# Patient Record
Sex: Female | Born: 1995 | Race: White | Hispanic: No | Marital: Single
Health system: Southern US, Community
[De-identification: ages and names within clinical notes are randomized; demographics above are authoritative.]

---

## 2017-10-12 DIAGNOSIS — F39 Unspecified mood [affective] disorder: Secondary | ICD-10-CM | POA: Insufficient documentation

## 2017-10-12 DIAGNOSIS — F4323 Adjustment disorder with mixed anxiety and depressed mood: Secondary | ICD-10-CM | POA: Insufficient documentation

## 2017-10-12 DIAGNOSIS — J4520 Mild intermittent asthma, uncomplicated: Secondary | ICD-10-CM | POA: Insufficient documentation

## 2017-10-12 DIAGNOSIS — J301 Allergic rhinitis due to pollen: Secondary | ICD-10-CM | POA: Insufficient documentation

## 2017-10-12 DIAGNOSIS — L2084 Intrinsic (allergic) eczema: Secondary | ICD-10-CM | POA: Insufficient documentation

## 2017-10-12 DIAGNOSIS — E559 Vitamin D deficiency, unspecified: Secondary | ICD-10-CM | POA: Insufficient documentation

## 2017-10-12 DIAGNOSIS — J3081 Allergic rhinitis due to animal (cat) (dog) hair and dander: Secondary | ICD-10-CM | POA: Insufficient documentation

## 2018-10-02 DIAGNOSIS — I10 Essential (primary) hypertension: Secondary | ICD-10-CM | POA: Insufficient documentation

## 2018-10-18 ENCOUNTER — Other Ambulatory Visit: Payer: Self-pay

## 2018-10-18 ENCOUNTER — Encounter (HOSPITAL_COMMUNITY): Payer: Self-pay

## 2018-10-18 ENCOUNTER — Emergency Department (HOSPITAL_COMMUNITY): Payer: BC Managed Care – PPO

## 2018-10-18 ENCOUNTER — Emergency Department (HOSPITAL_COMMUNITY)
Admission: EM | Admit: 2018-10-18 | Discharge: 2018-10-18 | Disposition: A | Discharge status: Home / Self Care | Payer: BC Managed Care – PPO | Attending: Emergency Medicine | Admitting: Emergency Medicine

## 2018-10-18 DIAGNOSIS — I10 Essential (primary) hypertension: Secondary | ICD-10-CM | POA: Diagnosis not present

## 2018-10-18 DIAGNOSIS — R0789 Other chest pain: Secondary | ICD-10-CM | POA: Insufficient documentation

## 2018-10-18 DIAGNOSIS — R55 Syncope and collapse: Secondary | ICD-10-CM | POA: Diagnosis not present

## 2018-10-18 DIAGNOSIS — J4520 Mild intermittent asthma, uncomplicated: Secondary | ICD-10-CM | POA: Diagnosis not present

## 2018-10-18 LAB — URINALYSIS, ROUTINE W REFLEX MICROSCOPIC
Bilirubin Urine: NEGATIVE
Glucose, UA: NEGATIVE mg/dL
Hgb urine dipstick: NEGATIVE
Ketones, ur: NEGATIVE mg/dL
Nitrite: NEGATIVE
Protein, ur: 30 mg/dL — AB
Specific Gravity, Urine: 1.02 (ref 1.005–1.030)
pH: 5 (ref 5.0–8.0)

## 2018-10-18 LAB — BASIC METABOLIC PANEL
Anion gap: 12 (ref 5–15)
BUN: 12 mg/dL (ref 6–20)
CO2: 25 mmol/L (ref 22–32)
Calcium: 9.6 mg/dL (ref 8.9–10.3)
Chloride: 102 mmol/L (ref 98–111)
Creatinine, Ser: 0.69 mg/dL (ref 0.44–1.00)
GFR calc Af Amer: >60 mL/min (ref >60)
GFR calc non Af Amer: >60 mL/min (ref >60)
Glucose, Bld: 118 mg/dL — ABNORMAL HIGH (ref 70–99)
Potassium: 3.6 mmol/L (ref 3.5–5.1)
Sodium: 139 mmol/L (ref 135–145)

## 2018-10-18 LAB — I-STAT BETA HCG BLOOD, ED (MC, WL, AP ONLY): I-stat hCG, quantitative: <5 m[IU]/mL (ref <5)

## 2018-10-18 LAB — CBC
HCT: 42.7 % (ref 36.0–46.0)
Hemoglobin: 14 g/dL (ref 12.0–15.0)
MCH: 28.7 pg (ref 26.0–34.0)
MCHC: 32.8 g/dL (ref 30.0–36.0)
MCV: 87.5 fL (ref 80.0–100.0)
Platelets: 391 10*3/uL (ref 150–400)
RBC: 4.88 MIL/uL (ref 3.87–5.11)
RDW: 11.8 % (ref 11.5–15.5)
WBC: 10.8 10*3/uL — ABNORMAL HIGH (ref 4.0–10.5)
nRBC: 0 % (ref 0.0–0.2)

## 2018-10-18 LAB — CBG MONITORING, ED: Glucose-Capillary: 107 mg/dL — ABNORMAL HIGH (ref 70–99)

## 2018-10-18 LAB — D-DIMER, QUANTITATIVE: D-Dimer, Quant: 0.31 ug/mL-FEU (ref 0.00–0.50)

## 2018-10-18 LAB — TROPONIN I (HIGH SENSITIVITY): Troponin I (High Sensitivity): <2 ng/L (ref <18)

## 2018-10-18 MED ORDER — SODIUM CHLORIDE 0.9% FLUSH
3.0000 mL | Freq: Once | INTRAVENOUS | Status: AC
  Administered 2018-10-18: 3 mL via INTRAVENOUS

## 2018-10-18 MED ORDER — SODIUM CHLORIDE 0.9 % IV BOLUS
1000.0000 mL | Freq: Once | INTRAVENOUS | Status: AC
  Administered 2018-10-18: 03:00:00 1000 mL via INTRAVENOUS

## 2018-10-18 NOTE — Discharge Instructions (Signed)
Make sure to eat regular meals and continue drinking lots of fluids. Follow-up with primary care doctor in the area.  Copies of labs and imaging studies on back for their review. Return here for any new/acute changes.

## 2018-10-18 NOTE — ED Triage Notes (Signed)
Pt reports that she woke up about 2 hours ago and walked to the bathroom, then woke up on the floor. She then went to the kitchen and passed out again and woke up on her kitchen floor with her spilled water. Pt is A&Ox4. Denies pain or injury.

## 2018-10-18 NOTE — ED Notes (Signed)
Upon entering the room, pt said has been feeling left side chest pain on and off for a week. It radiates over left shoulder to upper left back. Pt said, "It's not pain as much as uncomfortableness. It feels like I need to have my back crack, like there is pressure there."

## 2018-10-18 NOTE — ED Provider Notes (Signed)
[facility]-EMERGENCY DEPT Provider Note   CSN: [id number] Arrival date & time: 10/18/18  0107     History   Chief Complaint Chief Complaint  Patient presents with  . Loss of Consciousness    HPI Gloria Mendoza is a 23 y.o. female.     The history is provided by the patient and medical records.  Loss of Consciousness   23 year old female with history of adjustment disorder, eczema, mood disorder, seasonal allergies, vitamin D deficiency, essential hypertension, presenting to the ED after 2 syncopal events at home.  Reports she was sleeping and woke up to go to the bathroom and had a syncopal event in the bathroom.  States she did feel lightheaded, had blurred vision, and some ringing in the ears prior to this.  She is unsure how long she was unconscious.  States she was able to get up and went to the kitchen to get some water and apparently had another syncopal episode in the kitchen.  She lives alone so she called a friend who came over to check on her and she seemed to get better but wanted to be evaluated.  States currently she is feeling okay.  She is unsure if she hit her head during any of these episodes but denies any current headache, dizziness, confusion, numbness, weakness, blurred vision, tinnitus, changes in speech, or neck pain.  She is not currently on anticoagulation.  States she has been looking at the blood pressure monitor here and her pressures are somewhat lower than normal.  She is usually 130s/80s.  History reviewed. No pertinent past medical history.  Patient Active Problem List   Diagnosis Date Noted  . Essential hypertension 10/02/2018  . Adjustment disorder with mixed anxiety and depressed mood 10/12/2017  . Chronic allergic rhinitis due to animal hair and dander 10/12/2017  . Intrinsic eczema 10/12/2017  . Mild intermittent asthma without complication 10/12/2017  . Mood disorder (HCC) 10/12/2017  . Seasonal allergic rhinitis due to  pollen 10/12/2017  . Vitamin D deficiency 10/12/2017     OB History   No obstetric history on file.      Home Medications    Prior to Admission medications   Not on File    Family History History reviewed. No pertinent family history.  Social History Social History   Tobacco Use  . Smoking status: Not on file  Substance Use Topics  . Alcohol use: Not on file  . Drug use: Not on file     Allergies   Shellfish allergy, Bee venom, Cat hair extract, Dog epithelium, Dog epithelium allergy skin test, Molds & smuts, Other, and Pollen extract   Review of Systems Review of Systems  Cardiovascular: Positive for syncope.  Neurological: Positive for syncope.  All other systems reviewed and are negative.    Physical Exam Updated Vital Signs BP 102/70   Pulse 86   Temp 98.5 F (36.9 C) (Oral)   Resp (!) 22   SpO2 100%   Physical Exam Vitals signs and nursing note reviewed.  Constitutional:      Appearance: She is well-developed.     Comments: Awake, alert  HENT:     Head: Normocephalic and atraumatic.     Comments: No visible signs of head trauma Eyes:     Conjunctiva/sclera: Conjunctivae normal.     Pupils: Pupils are equal, round, and reactive to light.  Neck:     Musculoskeletal: Normal range of motion.  Cardiovascular:     Rate and  Rhythm: Normal rate and regular rhythm.     Heart sounds: Normal heart sounds.  Pulmonary:     Effort: Pulmonary effort is normal.     Breath sounds: Normal breath sounds. No stridor. No wheezing.  Abdominal:     General: Bowel sounds are normal.     Palpations: Abdomen is soft.     Hernia: No hernia is present.  Musculoskeletal: Normal range of motion.  Skin:    General: Skin is warm and dry.  Neurological:     Mental Status: She is alert and oriented to person, place, and time.     Comments: AAOx3, answering questions and following commands appropriately; equal strength UE and LE bilaterally; CN grossly intact; moves  all extremities appropriately without ataxia; no focal neuro deficits or facial asymmetry appreciated      ED Treatments / Results  Labs (all labs ordered are listed, but only abnormal results are displayed) Labs Reviewed  BASIC METABOLIC PANEL - Abnormal; Notable for the following components:      Result Value   Glucose, Bld 118 (*)    All other components within normal limits  CBC - Abnormal; Notable for the following components:   WBC 10.8 (*)    All other components within normal limits  URINALYSIS, ROUTINE W REFLEX MICROSCOPIC - Abnormal; Notable for the following components:   Color, Urine AMBER (*)    APPearance CLOUDY (*)    Protein, ur 30 (*)    Leukocytes,Ua SMALL (*)    Bacteria, UA RARE (*)    All other components within normal limits  CBG MONITORING, ED - Abnormal; Notable for the following components:   Glucose-Capillary 107 (*)    All other components within normal limits  D-DIMER, QUANTITATIVE (NOT AT [facility])  CBG MONITORING, ED  I-STAT BETA HCG BLOOD, ED ([facility], [facility], [facility] ONLY)  TROPONIN I (HIGH SENSITIVITY)    EKG EKG Interpretation  Date/Time:  Monday October 18 2018 01:23:14 EDT Ventricular Rate:  98 PR Interval:    QRS Duration: 85 QT Interval:  338 QTC Calculation: 432 R Axis:   81 Text Interpretation:  Sinus rhythm Baseline wander in lead(s) V2 No old tracing to compare Confirmed by Ward, Baxter HireKristen [phone]([id number]) on 10/18/2018 1:28:32 AM   Radiology Dg Chest 2 View  Result Date: 10/18/2018 CLINICAL DATA:  Chest pain EXAM: CHEST - 2 VIEW COMPARISON:  None. FINDINGS: Heart and mediastinal contours are within normal limits. No focal opacities or effusions. No acute bony abnormality. IMPRESSION: No active cardiopulmonary disease. Electronically Signed   By: Charlett NoseKevin  Dover M.D.   On: 10/18/2018 04:04    Procedures Procedures (including critical care time)  Medications Ordered in ED Medications  sodium chloride flush (NS) 0.9 % injection 3 mL (3 mLs Intravenous  Given 10/18/18 0232)  sodium chloride 0.9 % bolus 1,000 mL (0 mLs Intravenous Stopped 10/18/18 0351)     Initial Impression / Assessment and Plan / ED Course  I have reviewed the triage vital signs and the nursing notes.  Pertinent labs & imaging results that were available during my care of the patient were reviewed by me and considered in my medical decision making (see chart for details).  23 y.o. F here after syncopal episode x2.  She denies any history of same.  She does report hypertension and current BP around 104 systolic is lower than her baseline, states she usually runs around 130/80.  She is awake, alert, appropriately oriented.  She did not sustain any injuries from  her episodes today.  Her vitals remain stable.  Screening labs were sent and are overall reassuring, no significant electrolyte imbalance or anemia.  UA without overt signs of infection and patient denies any urinary symptoms.  Orthostatics are appropriate.  She was given IV fluids.  3:44 AM Called back into room as patient now complaining of chest pain.  States for the past week she has had intermittent left sided chest pain.  She did not mention this to me previously.  She did have brief episode of sinus tach here, now resolved.  She does have mirena in place.  Denies recent travel, surgery, prolonged immobilization, or trauma.  No history of DVT or PE.  Will add d-dimer, troponin, and chest x-ray.  Patient is agreeable.  She does report feeling better after some IV fluids.  D-dimer and troponin are negative.  CXR clear.  Vitals remain stable.  She continues feeling well here.  Lower suspicion for acute cardiac or neurologic etiology of her syncope, it sounds vaso-vagal given prodrome of symptoms.  Feel she is stable for discharge home.  Recommended good oral hydration, regular meals.  Close follow-up with PCP.  Return here for any new or acute changes.  Final Clinical Impressions(s) / ED Diagnoses   Final diagnoses:   Syncope and collapse    ED Discharge Orders    None       Larene Pickett, PA-C 10/18/18 0500    Ward, Delice Bison, DO 10/18/18 [phone]

## 2018-10-18 NOTE — ED Notes (Signed)
Patient transported to X-ray 

## 2018-10-18 NOTE — ED Notes (Signed)
Called lab to add on D-dimer and troponin.

## 2020-04-02 IMAGING — CR DG CHEST 2V
2 series · 2 of 2 positions shown · non-contrast
Comparison: None.

CLINICAL DATA: Chest pain

EXAM:
CHEST - 2 VIEW

[w chest pa]
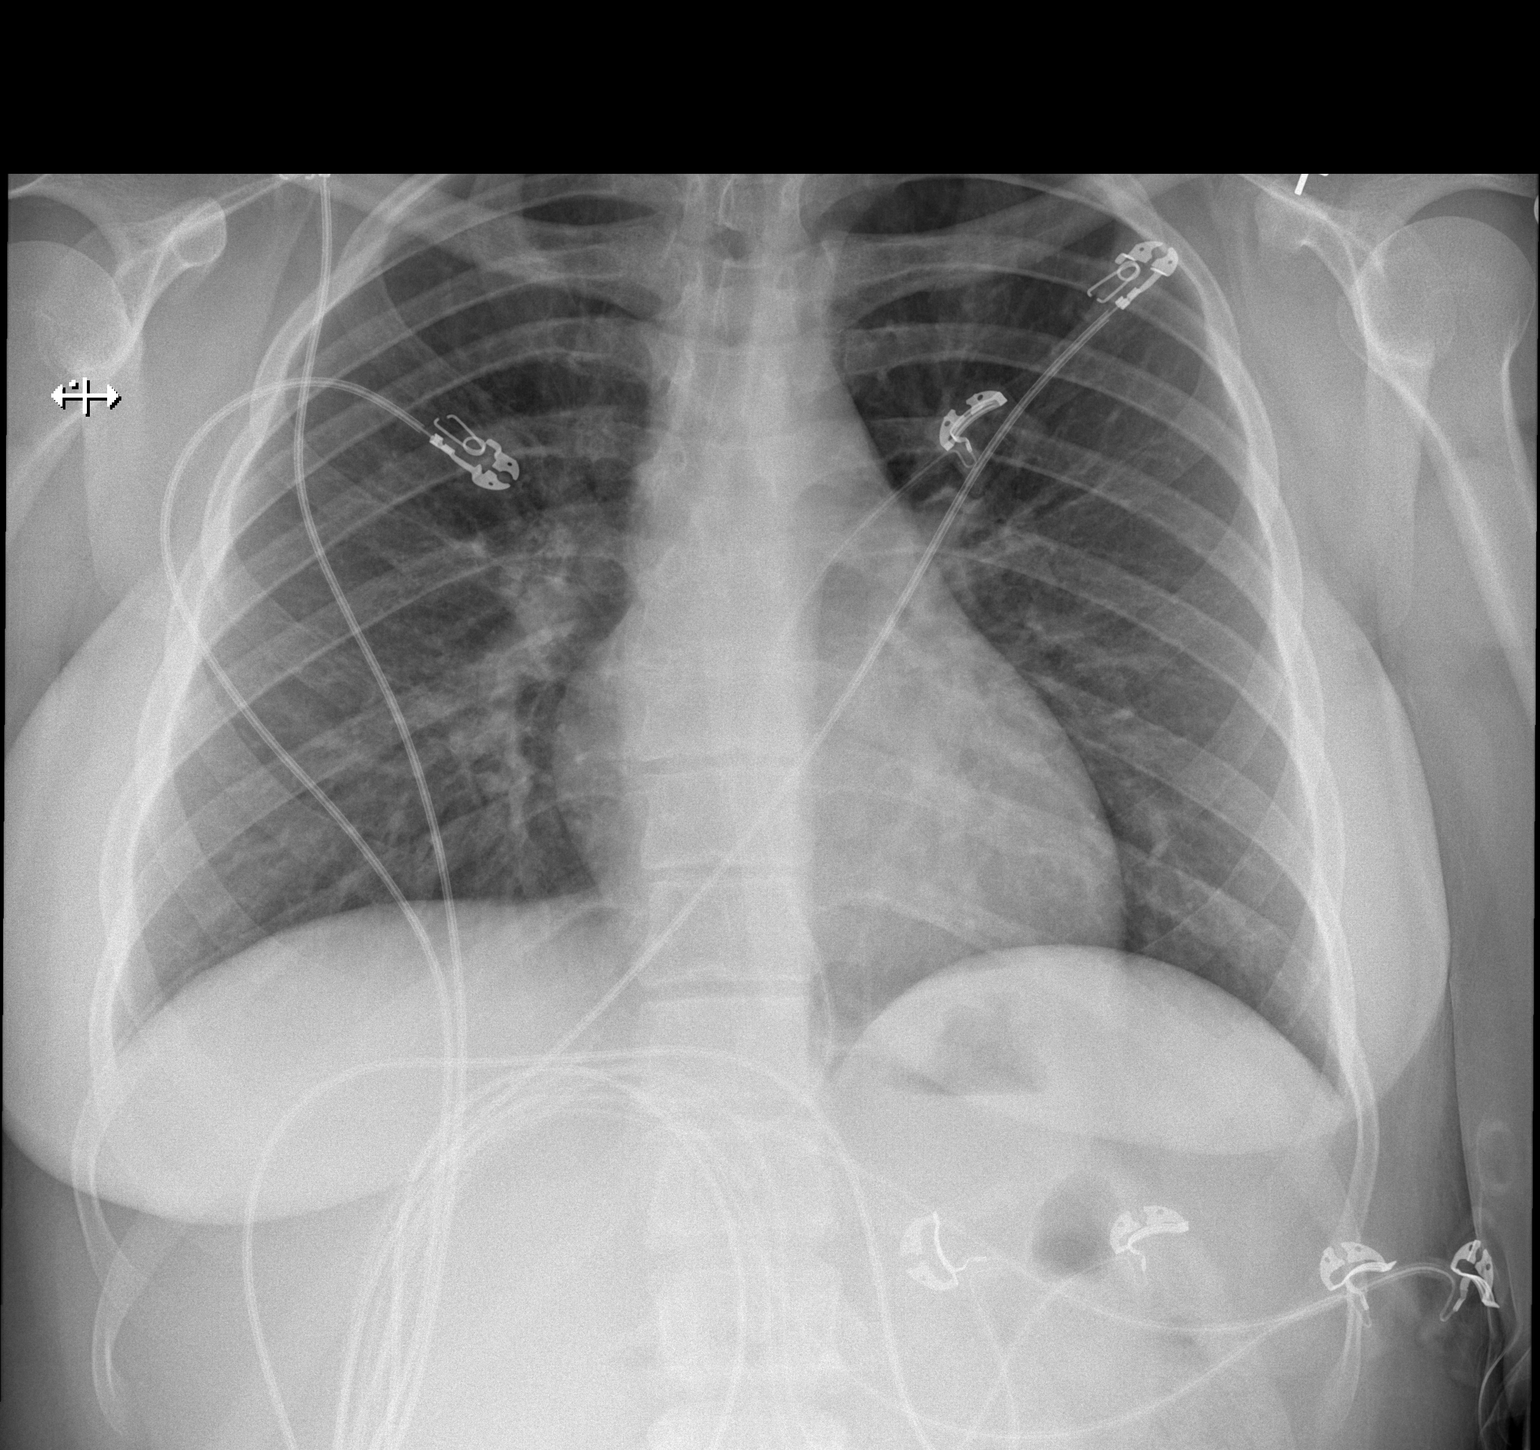

[w chest lat]
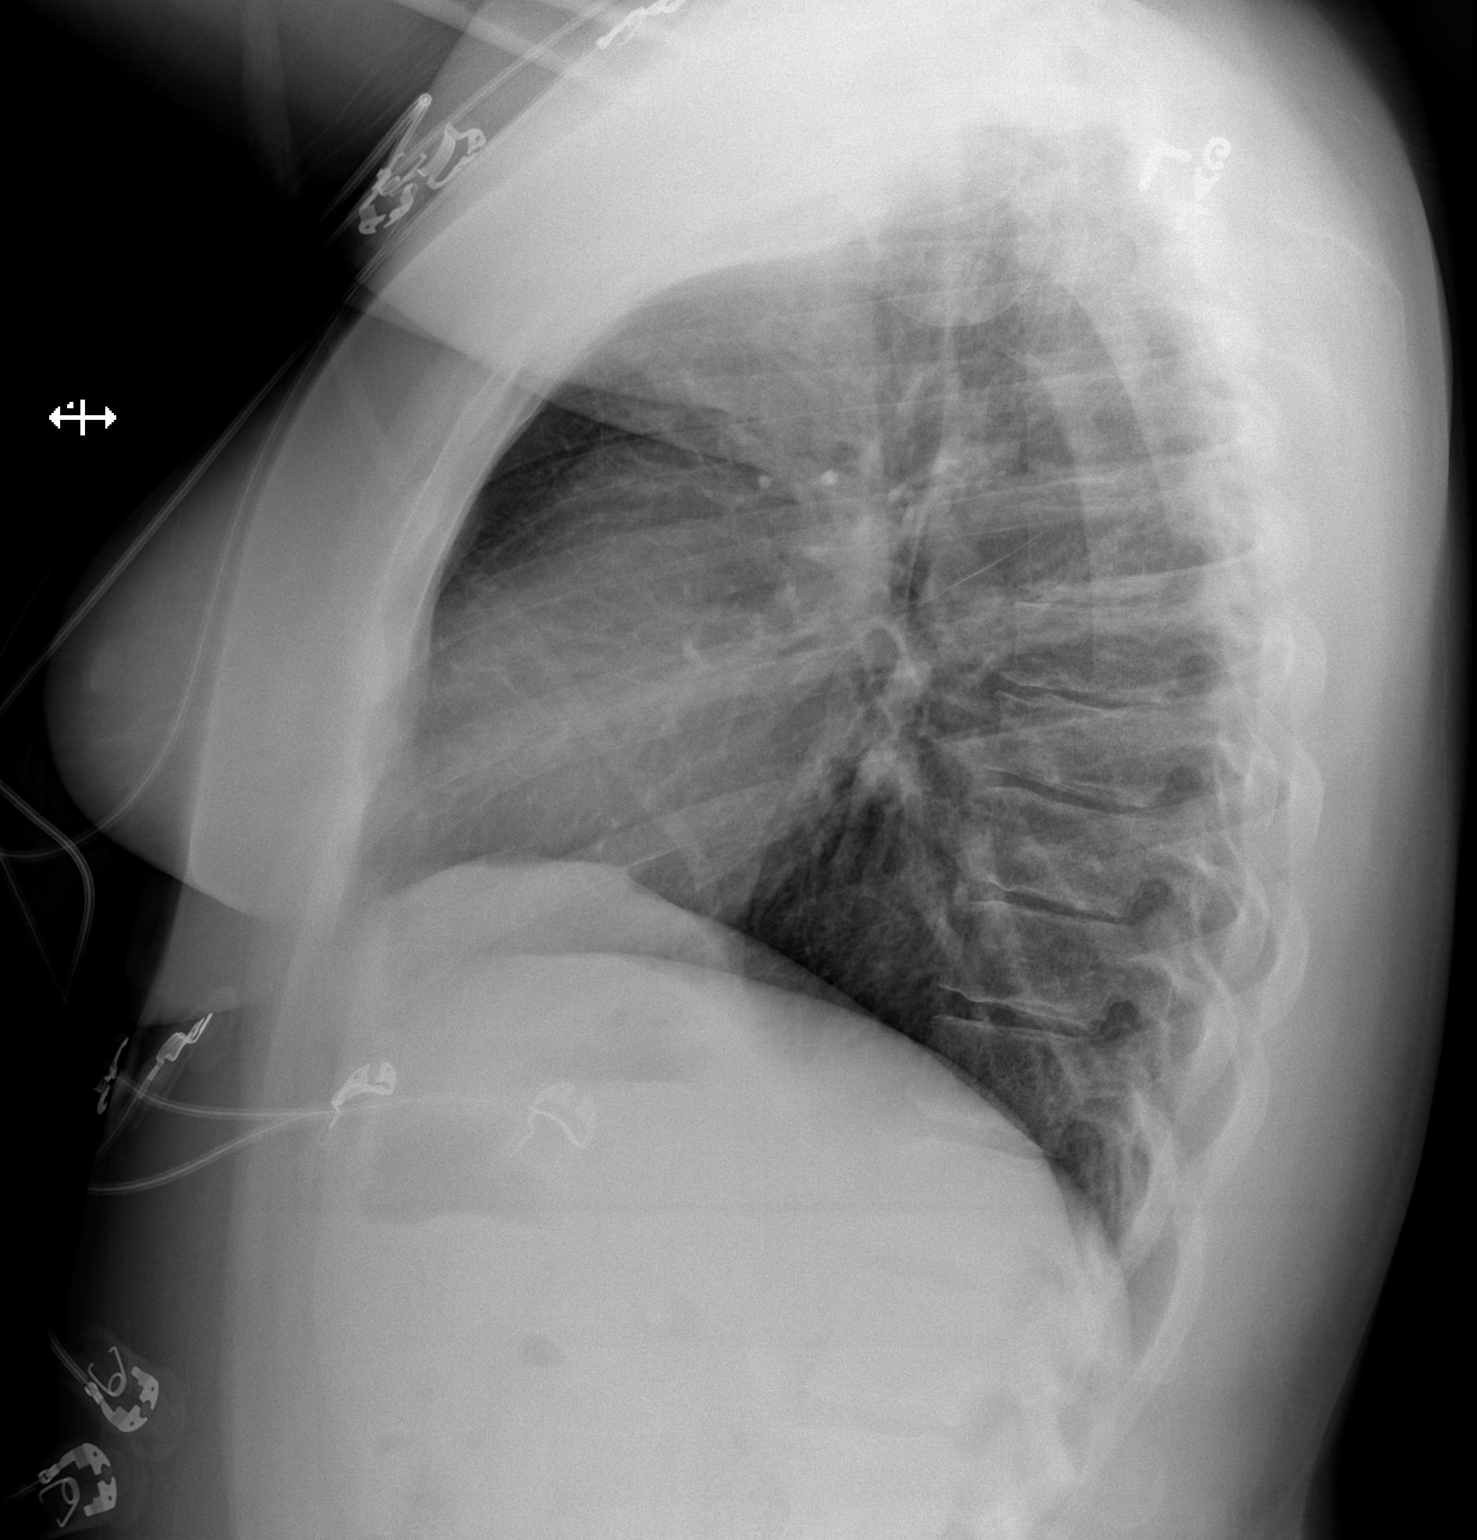

[2 of 2 positions shown; findings below may reference images not displayed]

FINDINGS: Heart and mediastinal contours are within normal limits. No focal
opacities or effusions. No acute bony abnormality.
IMPRESSION: No active cardiopulmonary disease.

## 2024-04-01 ENCOUNTER — Ambulatory Visit: Admitting: Family Medicine
